# Patient Record
Sex: Male | Born: 1965 | Hispanic: Yes | Marital: Married
Health system: Southern US, Academic
[De-identification: ages and names within clinical notes are randomized; demographics above are authoritative.]

---

## 2020-08-17 ENCOUNTER — Other Ambulatory Visit: Payer: Self-pay

## 2020-08-17 ENCOUNTER — Emergency Department (EMERGENCY_DEPARTMENT_HOSPITAL): Payer: Self-pay

## 2020-08-17 ENCOUNTER — Emergency Department
Admission: EM | Admit: 2020-08-17 | Discharge: 2020-08-18 | Disposition: A | Discharge status: Home / Self Care | Payer: Self-pay | Attending: Emergency Medicine | Admitting: Emergency Medicine

## 2020-08-17 DIAGNOSIS — R0602 Shortness of breath: Secondary | ICD-10-CM

## 2020-08-17 DIAGNOSIS — U071 COVID-19: Secondary | ICD-10-CM

## 2020-08-17 LAB — URINALYSIS, MACROSCOPIC
BILIRUBIN: NEGATIVE mg/dL
BLOOD: NEGATIVE mg/dL
COLOR: NORMAL
GLUCOSE: NEGATIVE mg/dL
KETONES: 20 mg/dL — AB
LEUKOCYTES: NEGATIVE WBCs/uL
NITRITE: NEGATIVE
PH: 5 (ref 5.0–8.0)
PROTEIN: NEGATIVE mg/dL
SPECIFIC GRAVITY: 1.016 (ref 1.005–1.030)
UROBILINOGEN: NEGATIVE mg/dL

## 2020-08-17 LAB — URINALYSIS, MICROSCOPIC
RBCS: 1 /hpf (ref <6.0)
WBCS: <1 /hpf (ref <4.0)

## 2020-08-17 LAB — CBC WITH DIFF
BASOPHIL #: <0.1 10*3/uL (ref <=0.20)
BASOPHIL %: 1 %
EOSINOPHIL #: <0.1 10*3/uL (ref <=0.50)
EOSINOPHIL %: 1 %
HCT: 38.4 % — ABNORMAL LOW (ref 38.9–52.0)
HGB: 13.1 g/dL — ABNORMAL LOW (ref 13.4–17.5)
IMMATURE GRANULOCYTE #: <0.1 10*3/uL (ref <0.10)
IMMATURE GRANULOCYTE %: 0 % (ref 0–1)
LYMPHOCYTE #: 2.14 10*3/uL (ref 1.00–4.80)
LYMPHOCYTE %: 25 %
MCH: 28.7 pg (ref 26.0–32.0)
MCHC: 34.1 g/dL (ref 31.0–35.5)
MCV: 84 fL (ref 78.0–100.0)
MONOCYTE #: 1.17 10*3/uL — ABNORMAL HIGH (ref 0.20–1.10)
MONOCYTE %: 13 %
MPV: 9.2 fL (ref 8.7–12.5)
NEUTROPHIL #: 5.33 10*3/uL (ref 1.50–7.70)
NEUTROPHIL %: 60 %
PLATELETS: 458 10*3/uL — ABNORMAL HIGH (ref 150–400)
RBC: 4.57 10*6/uL (ref 4.50–6.10)
RDW-CV: 14.3 % (ref 11.5–15.5)
WBC: 8.8 10*3/uL (ref 3.7–11.0)

## 2020-08-17 LAB — HEPATIC FUNCTION PANEL
ALBUMIN: 3.5 g/dL (ref 3.5–5.0)
ALKALINE PHOSPHATASE: 83 U/L (ref 45–115)
ALT (SGPT): 28 U/L (ref 10–55)
AST (SGOT): 32 U/L (ref 8–45)
BILIRUBIN DIRECT: 0.4 mg/dL (ref 0.1–0.4)
BILIRUBIN TOTAL: 0.9 mg/dL (ref 0.3–1.3)
PROTEIN TOTAL: 7.8 g/dL (ref 6.0–7.9)

## 2020-08-17 LAB — BASIC METABOLIC PANEL
ANION GAP: 14 mmol/L — ABNORMAL HIGH (ref 4–13)
BUN/CREA RATIO: 12 (ref 6–22)
BUN: 19 mg/dL (ref 8–25)
CALCIUM: 9.3 mg/dL (ref 8.5–10.0)
CHLORIDE: 100 mmol/L (ref 96–111)
CO2 TOTAL: 24 mmol/L (ref 22–30)
CREATININE: 1.57 mg/dL — ABNORMAL HIGH (ref 0.75–1.35)
GLUCOSE: 85 mg/dL (ref 65–125)
POTASSIUM: 4.5 mmol/L (ref 3.5–5.1)
SODIUM: 138 mmol/L (ref 136–145)

## 2020-08-17 LAB — COVID-19, FLU A/B, RSV RAPID BY PCR
INFLUENZA VIRUS TYPE A: NOT DETECTED
INFLUENZA VIRUS TYPE B: NOT DETECTED
RESPIRATORY SYNCTIAL VIRUS (RSV): NOT DETECTED
SARS-CoV-2: DETECTED — AB

## 2020-08-17 LAB — LIPASE: LIPASE: 48 U/L (ref 10–60)

## 2020-08-17 LAB — TROPONIN-I (FOR ED ONLY): TROPONIN I: <7 ng/L (ref 0–30)

## 2020-08-17 LAB — HEPATITIS C ANTIBODY SCREEN WITH REFLEX TO HCV PCR: HCV ANTIBODY QUALITATIVE: NEGATIVE

## 2020-08-17 LAB — HIV1/HIV2 SCREEN, COMBINED ANTIGEN AND ANTIBODY: HIV SCREEN, COMBINED ANTIGEN & ANTIBODY: NEGATIVE

## 2020-08-17 MED ORDER — ELECTROLYTE-A INTRAVENOUS SOLUTION BOLUS
1000.0000 mL | INTRAVENOUS | Status: AC
Start: 2020-08-18 — End: 2020-08-17
  Administered 2020-08-17: 23:00:00 1000 mL via INTRAVENOUS
  Administered 2020-08-17: 0 mL via INTRAVENOUS

## 2020-08-17 MED ORDER — SODIUM CHLORIDE 0.9 % (FLUSH) INJECTION SYRINGE
2.0000 mL | INJECTION | Freq: Three times a day (TID) | INTRAMUSCULAR | Status: DC
Start: 2020-08-17 — End: 2020-08-18

## 2020-08-17 MED ORDER — SODIUM CHLORIDE 0.9 % (FLUSH) INJECTION SYRINGE
2.0000 mL | INJECTION | INTRAMUSCULAR | Status: DC | PRN
Start: 2020-08-17 — End: 2020-08-18

## 2020-08-17 MED ORDER — ACETAMINOPHEN 325 MG TABLET
650.0000 mg | ORAL_TABLET | ORAL | Status: AC
Start: 2020-08-18 — End: 2020-08-17
  Administered 2020-08-17: 23:00:00 650 mg via ORAL
  Filled 2020-08-17: qty 2

## 2020-08-17 MED ORDER — SODIUM CHLORIDE 0.9 % INTRAVENOUS SOLUTION
INTRAVENOUS | Status: DC
Start: 2020-08-17 — End: 2020-08-18

## 2020-08-17 NOTE — ED Attending Note (Signed)
I was physically present and directly supervised this patient's care. Patient was seen and examined. The midlevel's/resident's history and exam were reviewed. Key elements in addition to and/or correction of that documentation are as follows:    Chief Complaint   Patient presents with   . Sore Throat     Since yesterday-- states co-worker CoVid +   . Generalized Body Aches     Unknown fevers; denies N/V/D       Christian Hickman is a 55 y.o. male p/w sore throat and generalized body aches.  Patient reports sore throat and body aches since yesterday.  He reports some associated shortness of breath.  He denies fever or chest pain.  He denies any abdominal pain.  Patient does not currently take any medications.  He states he has required cardioversion in the past, but denies any additional medical history.  He has received all three COVID-19 vaccines.    See resident note for further details surrounding HPI      Pertinent Exam  Filed Vitals:    08/17/20 2038   BP: (!) 143/84   Pulse: 79   Resp: 18   Temp: 38 C (100.4 F)   SpO2: 96%     Agree w resident    Alert, no acute distress  No external signs of respiratory distress   Moving all extremities spontaneously  See resident physician no regarding throat exam    Course  .  Patient presents to ED with primary complaint of sore throat and generalized body aches with associated symptoms as stated above  . Patient reports sore throat and generalized body aches since yesterday with associated shortness of breath  . Patient does not appear to be in any acute distress on physical exam; he has no external signs of respiratory distress  . Given patient's symptoms, decision made to obtain basic laboratory studies, COVID-19/influenza/ RSV testing and cardiac workup for further evaluation  . EKG with sinus rhythm, no acute ischemic changes  . Laboratory results remarkable for mild anemia with hemoglobin of 13.1, thrombocytosis of 458, creatinine 1.57 ( no previous for comparison  per chart review ), anion gap 14, normal glucose, negative troponin, positive COVID-19 testing, otherwise overall unremarkable  . Chest x-ray without acute process  . Patient given IV fluid bolus and Tylenol while in ED  . Repeat BMP obtained and creatinine improving at 1.40  . Patient with normal ambulatory pulse oximetry while in ED   . Patient without hypoxia on room air, no hypotension and no tachycardia while in ED   . Given results, feel the patient is suitable for discharge at this time   . Patient discharged with instructions to follow-up with family medicine or return to ED if symptoms worsen; he was given hydration instructions    Impressions:  COVID-19 infection    Dispo:  Discharged    Additional verbal discharge instructions were given and discussed with the patient    Jerl Mina, MD 08/17/2020, 22:18  Emergency Medicine Attending    Chart completed after conclusion of patient care due to time constraints of direct patient care during shift.     THIS NOTE IS CREATED USING VOICE RECOGNITION SOFTWARE.  THE CHART HAS BEEN REVIEWED IN REAL TIME.  ERRORS AND OMISSIONS THAT WERE MISSED ARE UNINTENDED.

## 2020-08-17 NOTE — ED Nurses Note (Signed)
The risk and benefits have been discussed with the patient in regards to placing a peripheral intravenous catheter (PIV) and/or drawing labs in the triage area.  The patient was advised that they may have to wait in the ED waiting room after the PIV is placed.  The patient was advised not to tamper with the PIV or infuse anything through the PIV.  The patient was advised if there is any concern or problem with the PIV to contact a nurse or a staff representative to contact a nurse for the patient.  The patient was advised if they choose to leave before being seen by a provider or without treatment; the patient needs to notify the a nurse or a staff representative to contact a nurse, so the PIV can be removed.  The patient is aware not to leave the ED waiting room area while the PIV is in place.  The patient verbalizes understanding and agrees with the plan of action.    An 18g PIV was placed in the Left Hand. After preparing the site with a Chlora-prep.  Labs were drawn, labeled (after checking the pt's I.D., band and labels confirming the pt's identification) and sent to the lab. The PIV was covered with a sterile dressing and secured with tape.

## 2020-08-17 NOTE — ED Provider Notes (Signed)
Christian Hickman  Provider Note    Name: Christian Hickman  Age and Gender: 55 y.o. male  Date of Birth: 04/24/1966  Date of Service: 08/17/2020   MRN: [id number]  PCP: No Pcp    Chief Complaint   Patient presents with   . Sore Throat     Since yesterday-- states co-worker CoVid +   . Generalized Body Aches     Unknown fevers; denies N/V/D       HPI:  Arrival: The patient arrived by private car and is alone  History Limitations: none    Christian Hickman is a 55 y.o. male presenting with sore throat and body aches.    Patient states that for the past 24 hours he has been experiencing sore throat and generalized body aches.  Few days prior he was exposed to COVID-19 by co-worker who ended up tested positive yesterday.  He denies fevers or chills, denies shortness of breath or cough, denies chest pain, nausea vomiting or diarrhea.  Patient is vaccinated x2 w/ a booster shot.  Patient states he is from [place] and is here working on a job site for the next 2 months.    ROS:  Constitutional: No fever, chills or weakness   HENT: No headaches, or congestion  Cardio: No chest pain, palpitations or leg swelling   Respiratory: No cough, wheezing or SOB  GI:  No nausea, vomiting or stool changes  GU:  No dysuria, hematuria, or increased frequency  All other systems reviewed and are negative.  Review of Systems        Below pertinent information reviewed with patient and/or EMR:  No past medical history on file.  Medications Prior to Admission     None        Allergies   Allergen Reactions   . Penicillins      No past surgical history on file.  Family Medical History:    None            Objective:  ED Triage Vitals [08/17/20 2038]   BP (Non-Invasive) (!) 143/84   Heart Rate 79   Respiratory Rate 18   Temperature 38 C (100.4 F)   SpO2 96 %   Weight 97.3 kg (214 lb 8.1 oz)   Height 1.854 m (6\' 1" )     Nursing notes and vital signs reviewed.    Constitutional:  55 y.o. male who appears stated age  in good health and in no distress. Normal color, no cyanosis.   HENT:   Head: Normocephalic and atraumatic.   Mouth/Throat: Oropharynx is clear and moist.  Tonsils without exudate, erythema or edema  Eyes: EOMI, PERRL   Neck: Trachea midline. Neck supple.  No lymphadenopathy appreciated  Cardiovascular: RRR, No murmurs, rubs or gallops. Intact distal pulses.  Pulmonary/Chest: BS equal bilaterally. No respiratory distress. No wheezes, rales or chest tenderness.   Abdominal: BS +. Abdomen soft, no tenderness, rebound or guarding.  Skin: warm and dry. No rash, erythema, pallor or cyanosis    Labs:   Labs Reviewed   BASIC METABOLIC PANEL - Abnormal; Notable for the following components:       Result Value    ANION GAP 14 (*)     CREATININE 1.57 (*)     All other components within normal limits   COVID-19, FLU A/B, RSV RAPID BY PCR - Abnormal; Notable for the following components:    SARS-CoV-2 Detected (*)     All other  components within normal limits    Narrative:     Results are for the simultaneous qualitative identification of SARS-CoV-2 (formerly 2019-nCoV), Influenza A, Influenza B, and RSV RNA. These etiologic agents are generally detectable in nasopharyngeal and nasal swabs during the ACUTE PHASE of infection. Hence, this test is intended to be performed on respiratory specimens collected from individuals with signs and symptoms of upper respiratory tract infection who meet Centers for Disease Control and Prevention (CDC) clinical and/or epidemiological criteria for Coronavirus Disease 2019 (COVID-19) testing. CDC COVID-19 criteria for testing on human specimens is available at [facility] webpage information for Healthcare Professionals: Coronavirus Disease 2019 (COVID-19) ([email or web address]).     False-negative results may occur if the virus has genomic mutations, insertions, deletions, or rearrangements or if performed very early in the course of illness. Otherwise, negative  results indicate virus specific RNA targets are not detected, however negative results do not preclude SARS-CoV-2 infection/COVID-19, Influenza, or Respiratory syncytial virus infection. Results should not be used as the sole basis for patient management decisions. Negative results must be combined with clinical observations, patient history, and epidemiological information. If upper respiratory tract infection is still suspected based on exposure history together with other clinical findings, re-testing should be considered.    Disclaimer:   This assay has been authorized by FDA under an Emergency Use Authorization for use in laboratories certified under the Clinical Laboratory Improvement Amendments of 1988 (CLIA), 42 U.S.C. [phone], to perform high complexity tests. The impacts of vaccines, antiviral therapeutics, antibiotics, chemotherapeutic or immunosuppressant drugs have not been evaluated.     Test methodology:   Cepheid Xpert Xpress SARS-CoV-2/Flu/RSV Assay real-time polymerase chain reaction (RT-PCR) test on the GeneXpert Dx and Xpert Xpress systems.   CBC WITH DIFF - Abnormal; Notable for the following components:    HGB 13.1 (*)     HCT 38.4 (*)     PLATELETS 458 (*)     MONOCYTE # 1.17 (*)     All other components within normal limits   URINALYSIS, MACROSCOPIC - Abnormal; Notable for the following components:    KETONES 20  (*)     All other components within normal limits   BASIC METABOLIC PANEL - Abnormal; Notable for the following components:    SODIUM 135 (*)     ANION GAP 14 (*)     CREATININE 1.40 (*)     ESTIMATED GFR 57 (*)     All other components within normal limits   LIPASE - Normal   HEPATIC FUNCTION PANEL - Normal   HEPATITIS C ANTIBODY SCREEN WITH REFLEX TO HCV PCR - Normal   HIV1/HIV2 SCREEN, COMBINED ANTIGEN AND ANTIBODY - Normal   URINALYSIS, MICROSCOPIC - Normal   TROPONIN-I (FOR ED ONLY) - Normal   CREATINE KINASE (CK), TOTAL, SERUM - Normal   CBC/DIFF    Narrative:     The following  orders were created for panel order CBC/DIFF.  Procedure                               Abnormality         Status                     ---------                               -----------         ------  CBC WITH DIFF[[id number]]                Abnormal            Final result                 Please view results for these tests on the individual orders.   URINALYSIS, MACROSCOPIC AND MICROSCOPIC W/CULTURE REFLEX    Narrative:     The following orders were created for panel order URINALYSIS, MACROSCOPIC AND MICROSCOPIC W/CULTURE REFLEX.  Procedure                               Abnormality         Status                     ---------                               -----------         ------                     URINALYSIS, MACROSCOPIC[[id number]]      Abnormal            Final result               URINALYSIS, MICROSCOPIC[[id number]]      Normal              Final result                 Please view results for these tests on the individual orders.       Imaging:  XR AP MOBILE CHEST   Final Result by Edi, Radresults In (03/31 0203)   No acute cardiopulmonary abnormality.            MDM/Course:  Christian Hickman is a 55 y.o. male who presented with sore throat, body aches.     Patient seen by and discussed with attending physician, Dr. Cleone Slim..course   Relevant/pertinent previous medical records reviewed via chart review activity and/or CareEverywhere activity.   MDM as per stated in ED Course     ED Course as of 08/18/20 0434   Thu Aug 18, 2020   0430 Patient seen and examined, appears vitally stable, satting well on room air, no acute distress [AF]   0430 CBC/DIFF(!)  CBC obtained with no leukocytosis to be of concern for systemic infection [AF]   0430 BASIC METABOLIC PANEL(!)  No gross electrolyte abnormalities, creatinine elevated at 1.57 however there is no prior values obtained, patient denies history of kidney problems.  Will give 1 L of fluids and recheck [AF]   0431 COVID-19, FLU A/B, RSV RAPID BY  PCR(!)  Given recent COVID exposure, COVID flu and RSV swab obtained and patient test positive for COVID [AF]   0431 URINALYSIS, MACROSCOPIC AND MICROSCOPIC W/CULTURE REFLEX(!)  UA obtained given elevated creatinine and is negative for infection or signs of stone [AF]   0431 TROPONIN-I (FOR ED ONLY)  Following tele patient he is COVID positive he became short of breath, likely anxiety driven however will obtain ACS workup, troponin found to be negative [AF]   0432 ECG 12-LEAD  Normal sinus rhythm, normal axis, normal intervals, no ST segment changes or dysrhythmia seen- ACS unlikely [AF]   0432 XR AP MOBILE CHEST  Chest x-ray clear  no acute cardiopulmonary process, therefore COVID unlikely to be severe at this time [AF]   0432 Patient given Tylenol and given ambulatory pulse ox challenge at which point he satted 95 and above.  Therefore return precautions given, patient given follow-up with family medicine while he is in the area, and is discharged home [AF]   [phone] Additionally following 1 L bolus of fluid creatinine noted to be trending down to 1.4, likely a component of dehydration [AF]      ED Course User Index  [AF] Verdie MosherForeman, Oden Lindaman E, MD       Clinical Impression:     Encounter Diagnosis   Name Primary?   . COVID-19 Yes       Medications given:  Medications Administered in the ED   electrolyte-A (PLASMALYTE-A) bolus infusion 1,000 mL (0 mL Intravenous Stopped 08/17/20 2344)   acetaminophen (TYLENOL) tablet (650 mg Oral Given 08/17/20 2317)   acetaminophen (TYLENOL) tablet (325 mg Oral Given 08/18/20 0146)       Following the below history, physical exam, and studies, the patient was deemed stable and suitable for discharge. The patient was advised to return to the ED for any new or worsening symptoms. Discharge medications, and follow-up instructions were discussed with the patient in detail, who verbalizes understanding. The patient is in agreement and is comfortable with the plan of care.     Disposition:  Discharged       Current Discharge Medication List      You have not been prescribed any medications.         [facility]   No follow-up provider specified.    Parts of this patients chart were completed in a retrospective fashion due to simultaneous direct patient care activities in the Emergency [facility].   This note was partially generated using MModal Fluency Direct system, and there may be some incorrect words, spellings, and punctuation that were not noted in checking the note before saving.      Truddie Coco/Joyous Gleghorn, MD 08/18/2020, 04:34   PGY 1 - Emergency Medicine  [facility] [employer]

## 2020-08-18 LAB — ECG 12-LEAD
Atrial Rate: 77 {beats}/min
Calculated P Axis: 17 degrees
Calculated R Axis: -15 degrees
Calculated T Axis: 19 degrees
PR Interval: 144 ms
QRS Duration: 84 ms
QT Interval: 360 ms
QTC Calculation: 407 ms
Ventricular rate: 77 {beats}/min

## 2020-08-18 LAB — BASIC METABOLIC PANEL
ANION GAP: 14 mmol/L — ABNORMAL HIGH (ref 4–13)
BUN/CREA RATIO: 13 (ref 6–22)
BUN: 18 mg/dL (ref 8–25)
CALCIUM: 8.9 mg/dL (ref 8.5–10.0)
CHLORIDE: 99 mmol/L (ref 96–111)
CO2 TOTAL: 22 mmol/L (ref 22–30)
CREATININE: 1.4 mg/dL — ABNORMAL HIGH (ref 0.75–1.35)
ESTIMATED GFR: 57 mL/min/BSA — ABNORMAL LOW (ref >=60)
GLUCOSE: 79 mg/dL (ref 65–125)
POTASSIUM: 4.3 mmol/L (ref 3.5–5.1)
SODIUM: 135 mmol/L — ABNORMAL LOW (ref 136–145)

## 2020-08-18 LAB — CREATINE KINASE (CK), TOTAL, SERUM: CREATINE KINASE: 95 U/L (ref 45–225)

## 2020-08-18 MED ORDER — ACETAMINOPHEN 325 MG TABLET
325.0000 mg | ORAL_TABLET | ORAL | Status: AC
Start: 2020-08-18 — End: 2020-08-18
  Administered 2020-08-18: 02:00:00 325 mg via ORAL
  Filled 2020-08-18: qty 1

## 2020-08-18 NOTE — ED Nurses Note (Signed)
Patient given discharge instructions. Verbalizes understanding and denies any questions or concerns at this time. PIV removed without difficulty, catheter intact upon removal, pressure applied to prevent bleeding. Patient to waiting room at this time.

## 2020-08-18 NOTE — Discharge Instructions (Signed)
You were seen in the ER for body aches and sore throat. You were found to be COVID-19 positive. You were able to maintain your oxygenation so you are being discharged home with follow-up. In addition, your kidney values were elevated, likely due to dehydration so please drink plenty of water and follow up with your primary care doctor about kidney function tests. Please return to the ER with any new or worsening symptoms.

## 2021-02-22 IMAGING — MR MRI KNEE LT WO CONTRAST
5 series · 40 of 40 positions shown · non-contrast
Comparison: None.

INDICATION: Synovitis and tendinitis left lower leg. Left knee pain, no specific injury. No history of surgery.
TECHNIQUE: Multiplanar, multiecho imaging of the left knee was performed, including T1-weighted and fluid sensitive sequences without intravenous contrast administration.

[Series 2: t2_axial_fs · axial · 4.0mm · 0.53mm/px · z∈[-69,+56]mm · 8 of 26 slices shown]
[im 1/26]
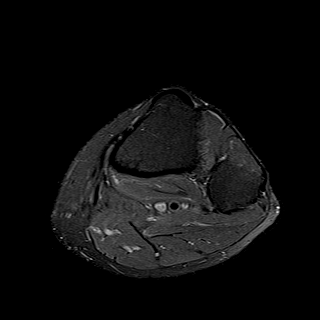
[im 4/26]
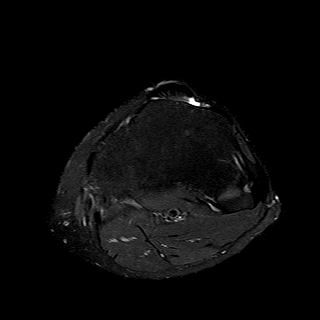
[im 8/26]
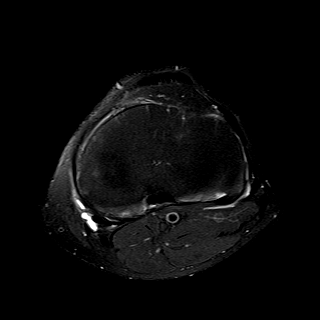
[im 11/26]
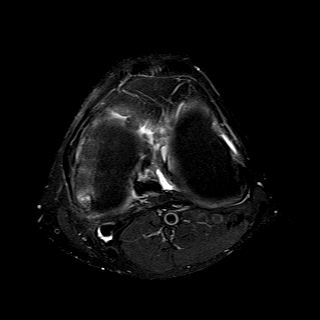
[im 15/26]
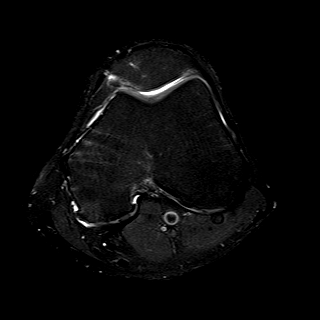
[im 18/26]
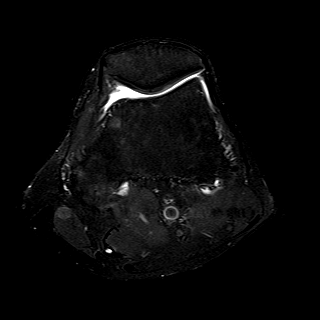
[im 22/26]
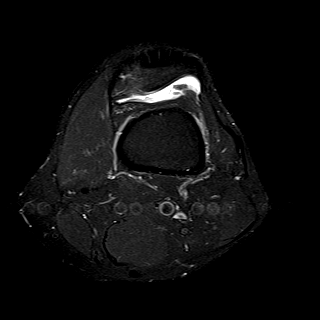
[im 26/26]
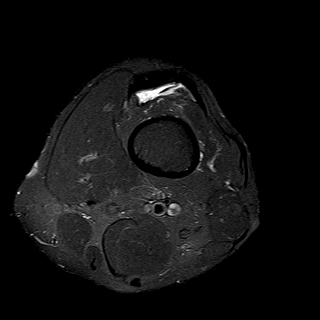

[Series 3: pd_sag_fs · sagittal · 3.0mm · 0.53mm/px · 9 of 32 slices shown]
[im 1/32]
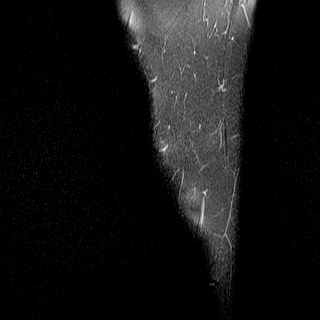
[im 4/32]
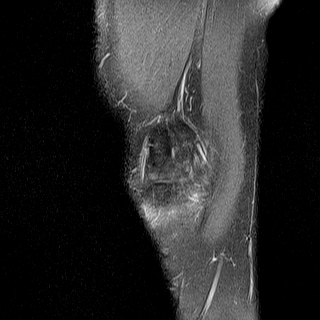
[im 8/32]
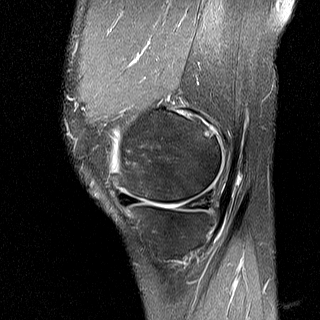
[im 12/32]
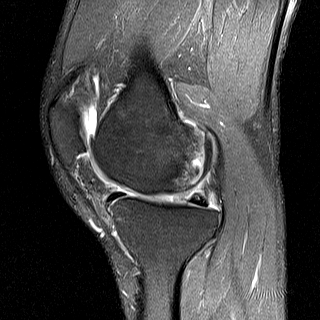
[im 16/32]
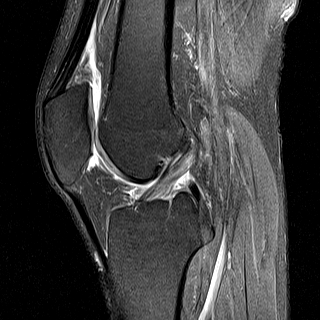
[im 20/32]
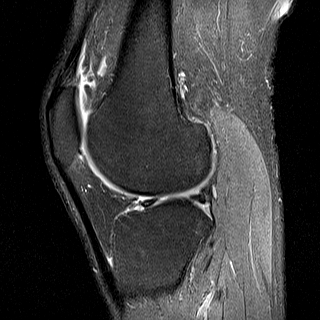
[im 24/32]
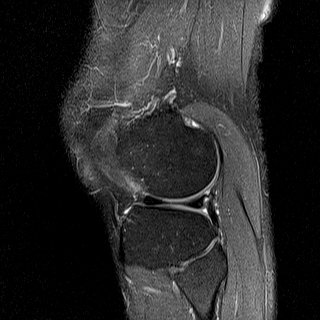
[im 28/32]
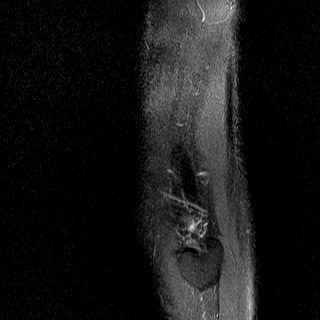
[im 32/32]
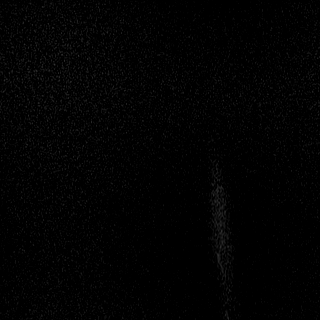

[Series 4: t2_sag_fs · sagittal · 3.0mm · 0.53mm/px · 9 of 32 slices shown]
[im 1/32]
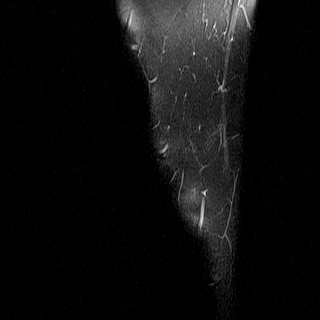
[im 4/32]
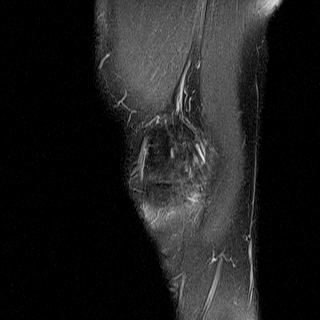
[im 8/32]
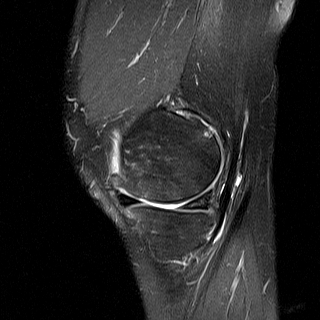
[im 12/32]
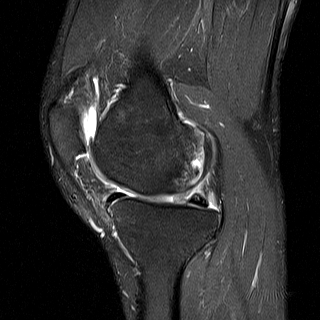
[im 16/32]
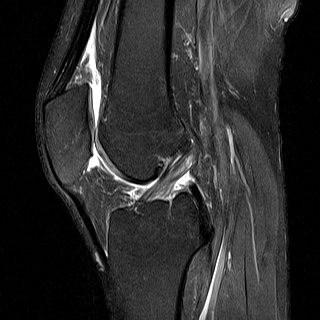
[im 20/32]
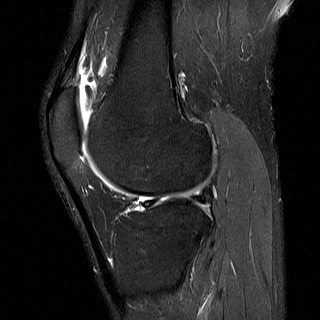
[im 24/32]
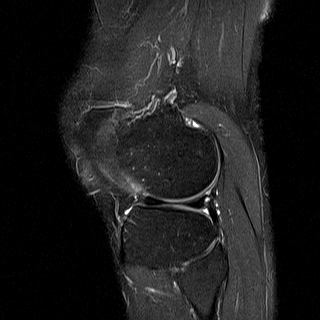
[im 28/32]
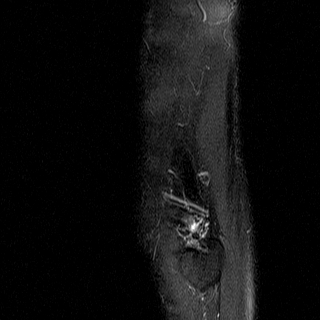
[im 32/32]
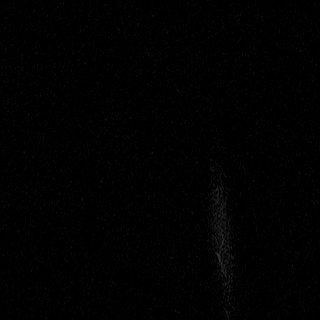

[Series 5: t1_cor · coronal · 4.0mm · 0.53mm/px · 7 of 25 slices shown]
[im 1/25]
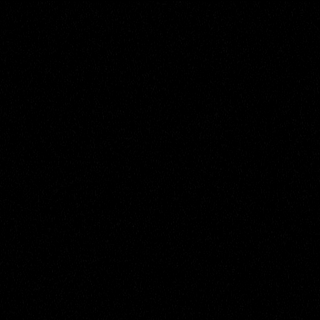
[im 5/25]
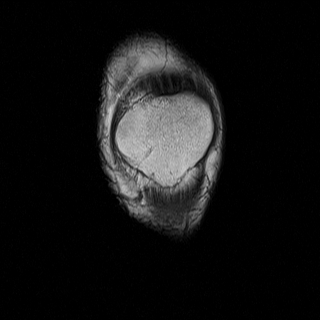
[im 9/25]
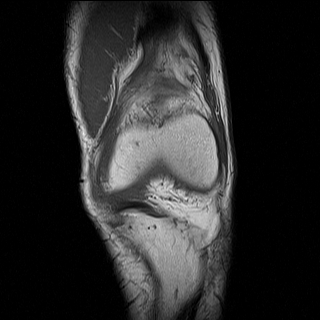
[im 13/25]
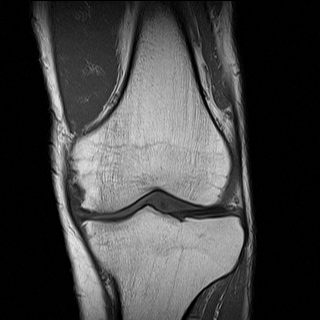
[im 17/25]
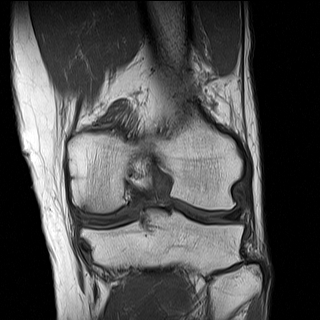
[im 21/25]
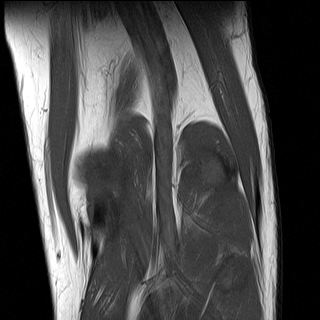
[im 25/25]
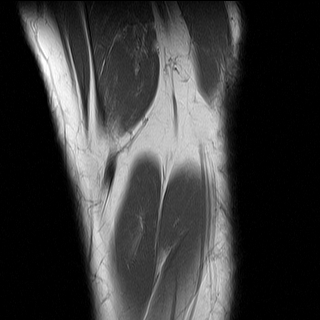

[Series 6: t2_cor_fs · coronal · 4.0mm · 0.53mm/px · 7 of 25 slices shown]
[im 1/25]
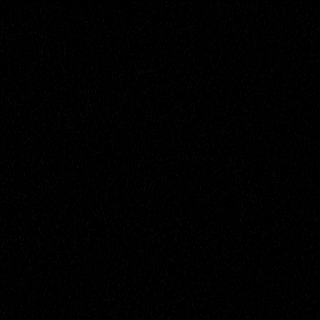
[im 5/25]
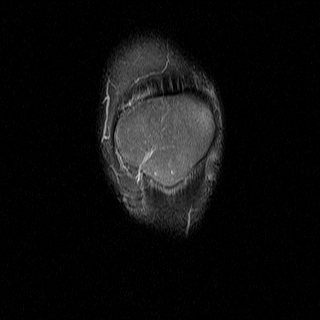
[im 9/25]
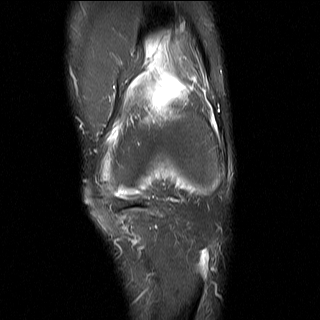
[im 13/25]
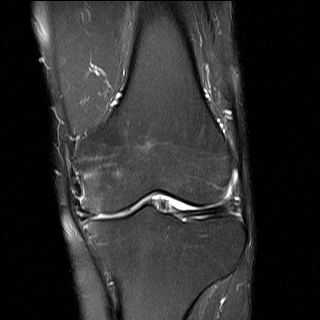
[im 17/25]
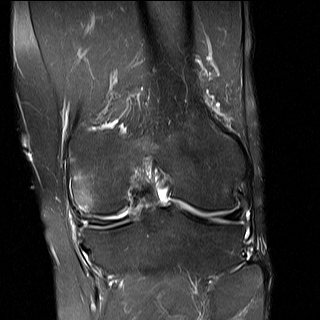
[im 21/25]
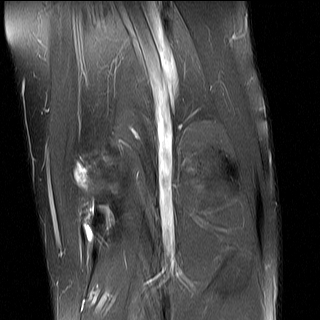
[im 25/25]
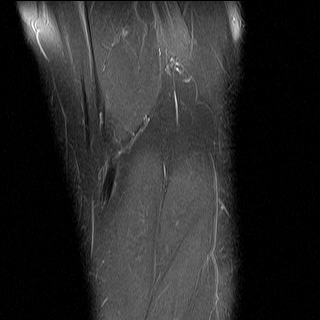

[40 of 40 positions shown; findings below may reference images not displayed]

FINDINGS: MEDIAL MENISCUS:  Complex tear of the body with large radial free edge and horizontal superior surface components, with displaced flap into the superior medial gutter. Subtle fraying at the free edge of the posterior root.

LATERAL MENISCUS:  Small radial tear at the free edge of the body.

ACL:  Intact.

PCL:  Intact.

MCL:  Intact.

LATERAL LIGAMENTS AND TENDONS:  Intact.

EXTENSOR MECHANISM:  Mild focal tendinosis of the proximal patellar tendon, at the proximal attachment.

FAT PADS:   Normal.

CARTILAGE:  

Patellofemoral compartment:  Chondral thinning at the central femoral trochlea.

Medial compartment:  Extensive high-grade partial-thickness and full-thickness chondral loss, most pronounced at the outer aspect of the weightbearing femoral condyle. High-grade chondral loss throughout the nonweightbearing femoral condyle.

Lateral compartment: Chondral thinning at the inner aspect of the tibial plateau.

BONE MARROW: Subchondral marrow edema about the medial compartment. No fracture. No erosion.

Small knee joint effusion. Mild pes anserine bursitis.
IMPRESSION: 1.
Complex tear of the medial meniscus body, with displaced flap into the superior medial gutter.

2.
Small radial tear at the free edge of the lateral meniscus body.

3.
Severe medial compartment chondral abnormalities.

4.
Mild patellofemoral and lateral compartment chondral abnormalities.

## 2021-02-22 IMAGING — MR MRI KNEE RT WO CONTRAST
5 series · 40 of 40 positions shown · non-contrast
Comparison: None.

INDICATION: Synovitis and tendinitis, right lower leg. Chronic right knee pain. No specific injury, no history of surgery.
TECHNIQUE: Multiplanar, multiecho imaging of the right knee was performed, including T1-weighted and fluid sensitive sequences without intravenous contrast administration.

[Series 2: t2_axial_fs · axial · 4.0mm · 0.53mm/px · z∈[-70,+55]mm · 8 of 26 slices shown]
[im 1/26]
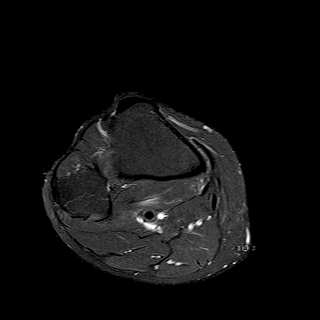
[im 4/26]
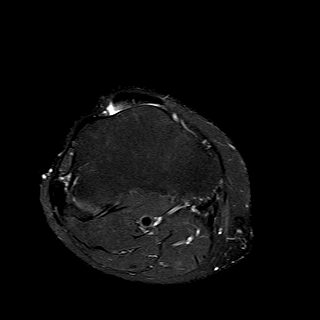
[im 8/26]
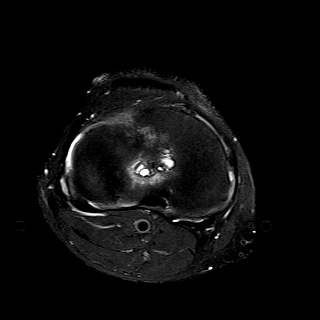
[im 11/26]
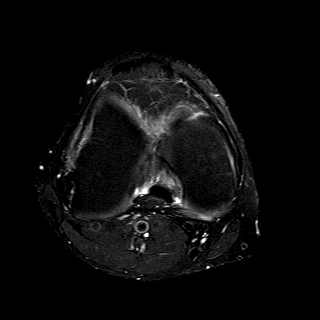
[im 15/26]
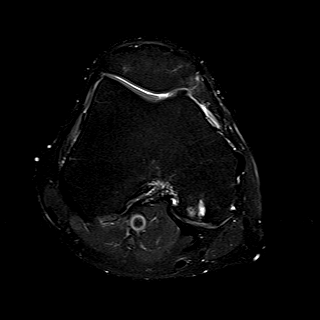
[im 18/26]
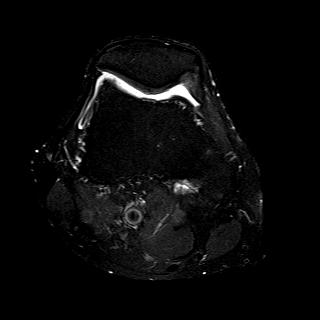
[im 22/26]
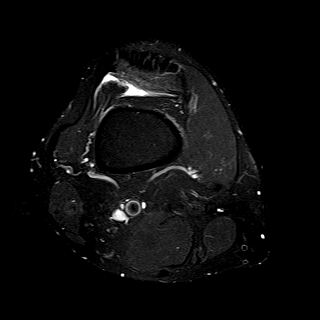
[im 26/26]
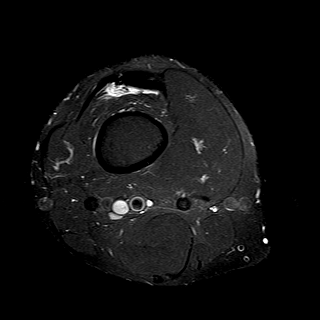

[Series 3: pd_sag_fs · sagittal · 3.0mm · 0.53mm/px · 9 of 31 slices shown]
[im 1/31]
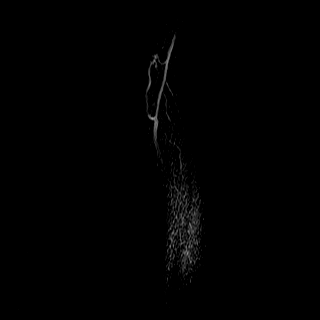
[im 4/31]
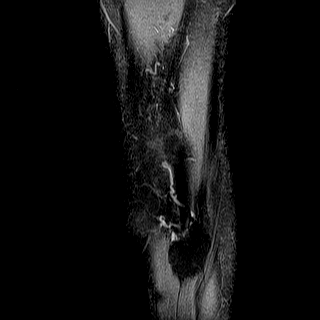
[im 8/31]
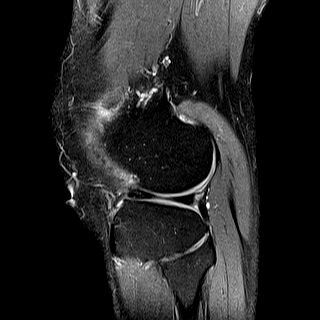
[im 12/31]
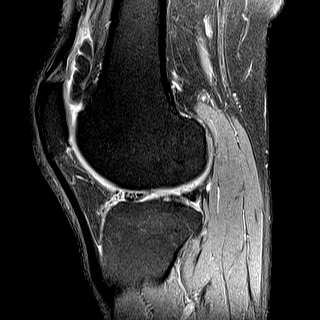
[im 16/31]
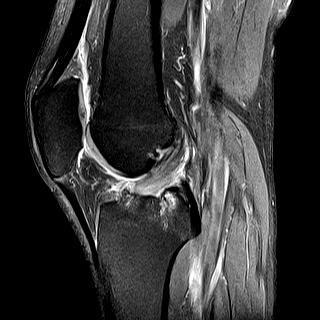
[im 19/31]
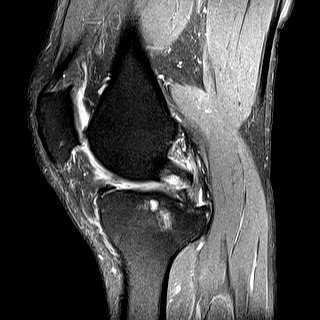
[im 23/31]
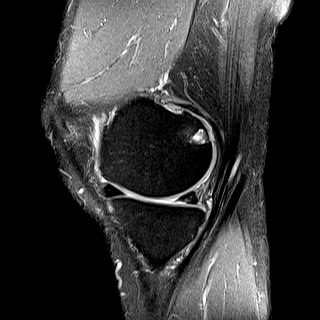
[im 27/31]
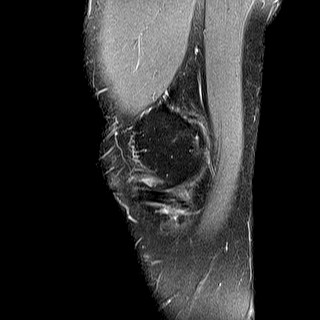
[im 31/31]
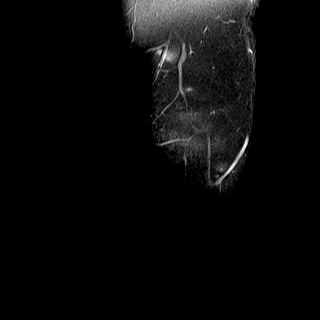

[Series 4: t2_sag_fs · sagittal · 3.0mm · 0.53mm/px · 9 of 31 slices shown]
[im 1/31]
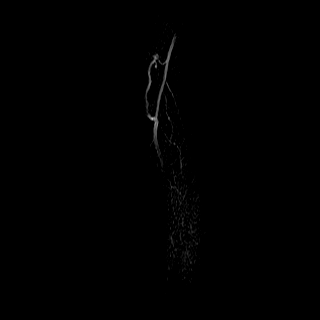
[im 4/31]
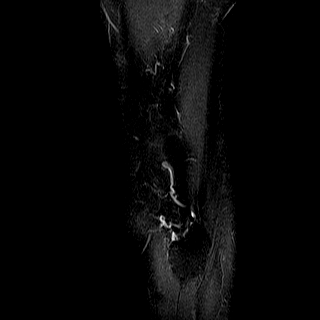
[im 8/31]
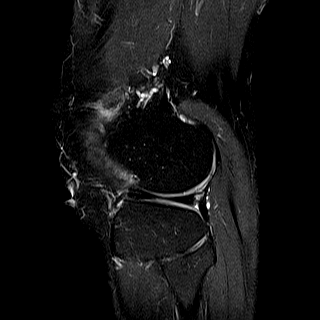
[im 12/31]
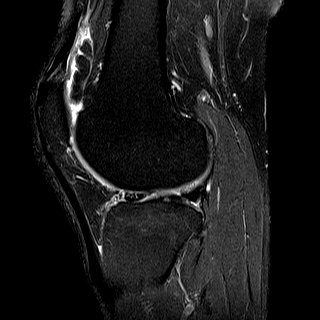
[im 16/31]
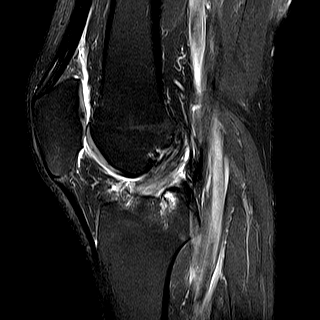
[im 19/31]
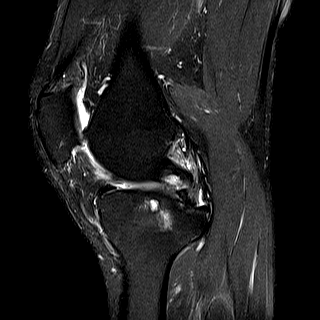
[im 23/31]
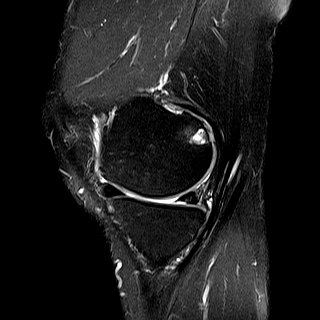
[im 27/31]
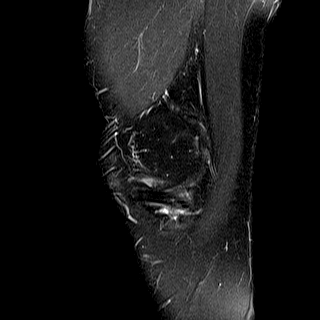
[im 31/31]
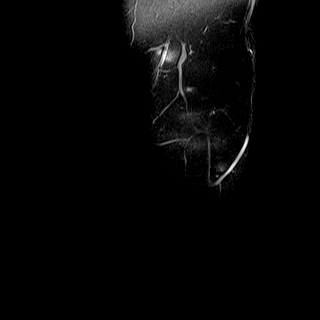

[Series 5: t1_cor · coronal · 4.0mm · 0.53mm/px · 7 of 25 slices shown]
[im 1/25]
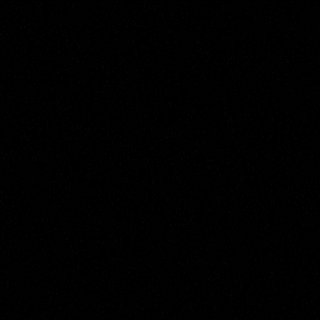
[im 5/25]
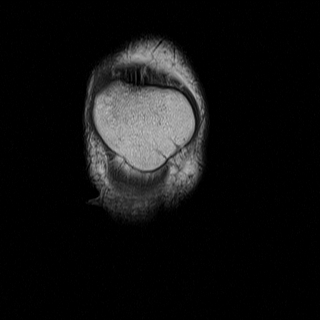
[im 9/25]
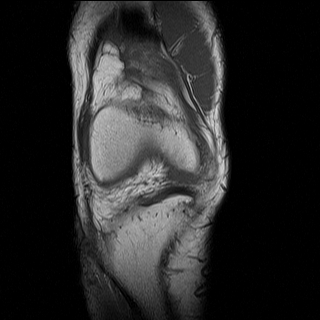
[im 13/25]
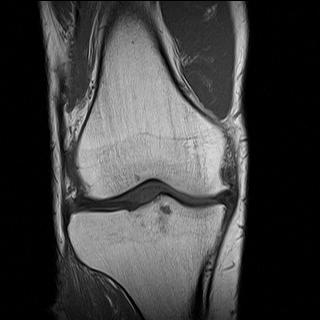
[im 17/25]
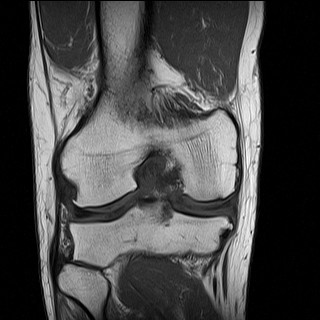
[im 21/25]
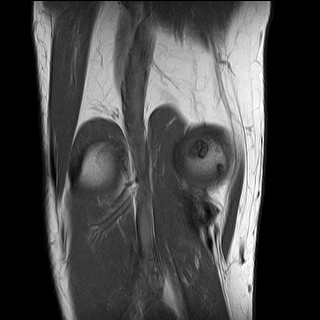
[im 25/25]
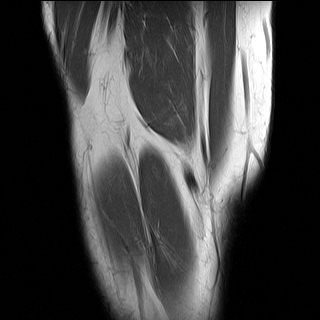

[Series 6: t2_cor_fs · coronal · 4.0mm · 0.53mm/px · 7 of 25 slices shown]
[im 1/25]
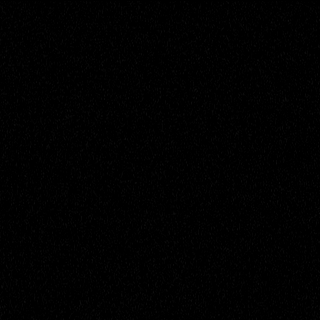
[im 5/25]
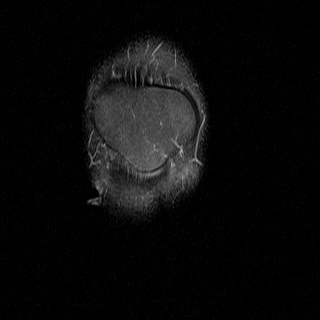
[im 9/25]
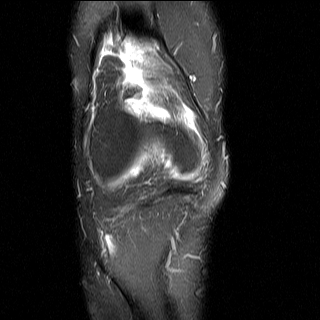
[im 13/25]
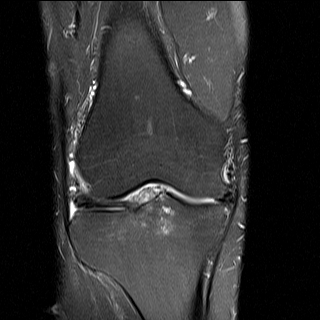
[im 17/25]
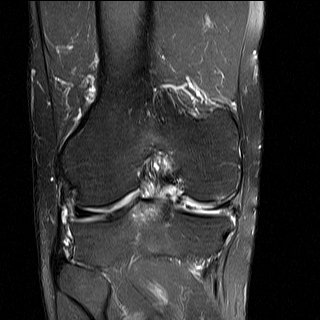
[im 21/25]
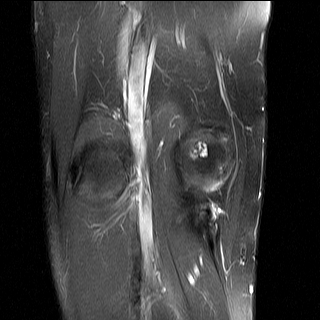
[im 25/25]
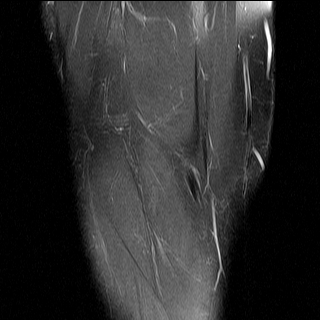

[40 of 40 positions shown; findings below may reference images not displayed]

FINDINGS: MEDIAL MENISCUS:  Complex tear of the posterior horn with radial free edge and oblique undersurface components, with displaced flap toward the posterior notch. Degenerative maceration of the body which is partially extruded.

LATERAL MENISCUS:  Degeneration of the anterior horn with small horizontal cleavage tear near the free edge.

ACL:  Mucoid degeneration.

PCL:  Intact.

MCL:  Intact.

LATERAL LIGAMENTS AND TENDONS:  Intact.

EXTENSOR MECHANISM:  Mild tendinosis of the proximal patellar tendon. No tear. Quadriceps tendon is intact.

FAT PADS:   Normal.

CARTILAGE:  

Patellofemoral compartment:  Chondral thinning and surface irregularity of the medial femoral trochlea. Small high-grade chondral defect at the inferior medial femoral trochlea.

Medial compartment:  Extensive chondral surface irregularity. Small high-grade chondral defect at the nonweightbearing femoral condyle. Small high-grade chondral defect at the outer aspect of the weightbearing femoral condyle.

Lateral compartment: High-grade chondral defects at the inner aspect of the tibial plateau. Chondral surface irregularity at the weightbearing femoral condyle.

BONE MARROW: No acute fracture. Subchondral cystic change at the nonweightbearing medial femoral condyle. Subcortical cystic change at the central tibial plateau. Small marginal osteophytes.

Small joint effusion. No Baker's cyst.
IMPRESSION: 1.
Complex tear of the posterior horn medial meniscus, with displaced flap toward the posterior notch. Degenerative maceration of the medial meniscus body, which is partially extruded.

2.
Degeneration of the anterior horn lateral meniscus with small horizontal cleavage tear near the free edge.

3.
Mucoid degeneration of the ACL.

4.
Moderate tricompartmental chondral abnormalities.

## 2024-06-21 DEATH — deceased
